# Patient Record
Sex: Female | Born: 1985 | Race: White | Hispanic: No | Marital: Married | Smoking: Former smoker
Health system: Southern US, Community
[De-identification: ages and names within clinical notes are randomized; demographics above are authoritative.]

## PROBLEM LIST (undated history)

## (undated) HISTORY — PX: DENTAL SURGERY: SHX609

## (undated) HISTORY — PX: CHOLECYSTECTOMY: SHX55

---
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
## 2005-11-20 IMAGING — US US PELVIS COMPLETE MODIFY
1 series · 14 of 25 positions shown · non-contrast
Comparison: none

CLINICAL DATA: Right lower quadrant and pelvic pain.
 US PELVIS COMPLETE/US TRANSVAGINAL
 Transabdominal and endovaginal sonography of the pelvis performed.
 Uterus measures 7.7 cm length x 3.1 cm AP x 4.1 cm transverse.  Endometrial stripe 14 mm thick, may be normal for phase of menses in a young lady.  No evidence of uterine mass or abnormal uterine fluid collection.  No free pelvic fluid.  Ovaries are normal in size and morphology bilaterally, both containing small follicles.  Right ovary 3.0 x 1.4 x 1.9 cm.  The left ovary is 3.2 x 1.8 x 2.2 cm.
 IMPRESSION
 Normal exam.

[Series 1: unknown · 0.25mm/px · 14 of 51 slices shown]
[im 1/51]
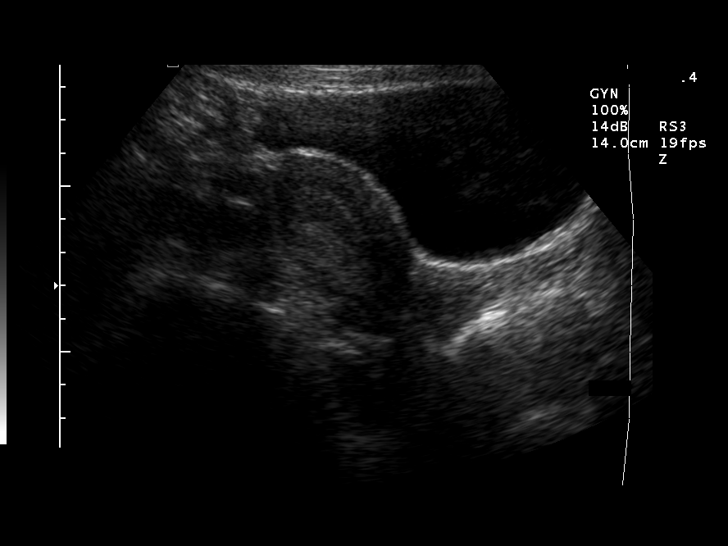
[im 5/51]
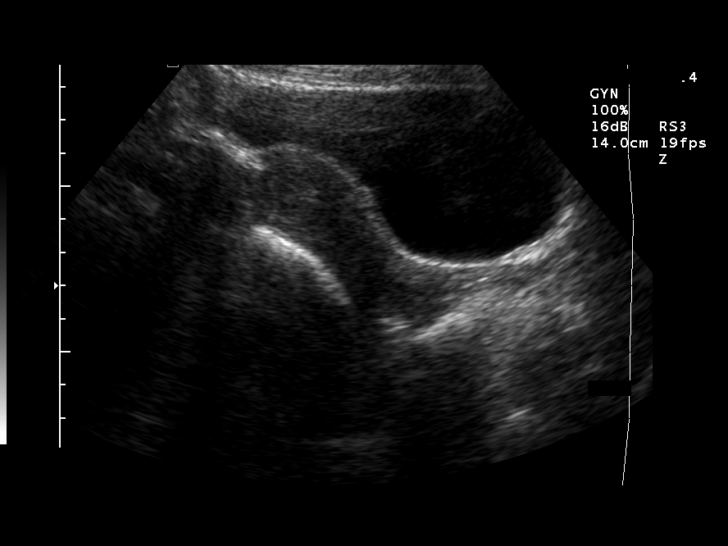
[im 9/51]
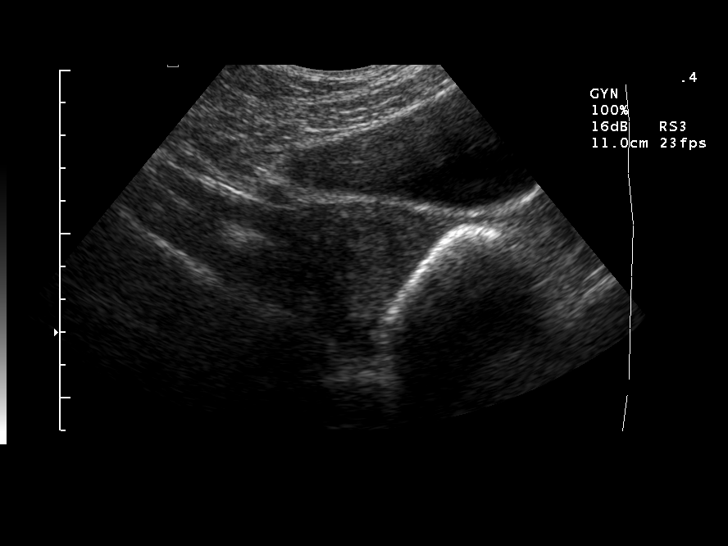
[im 13/51]
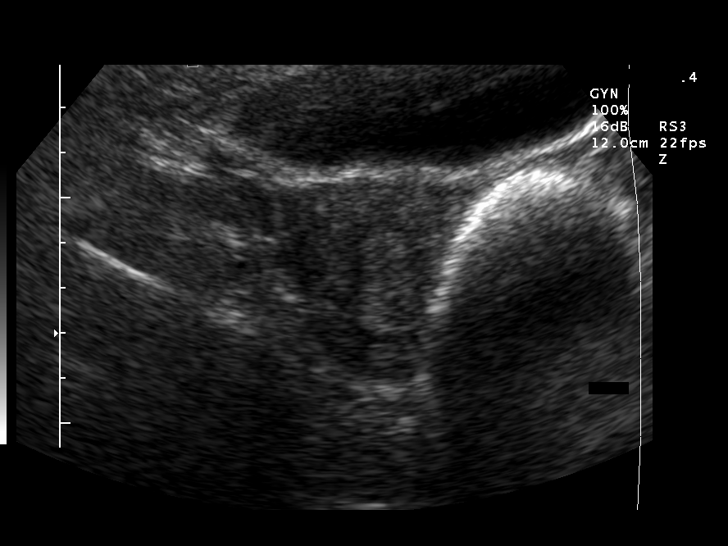
[im 17/51]
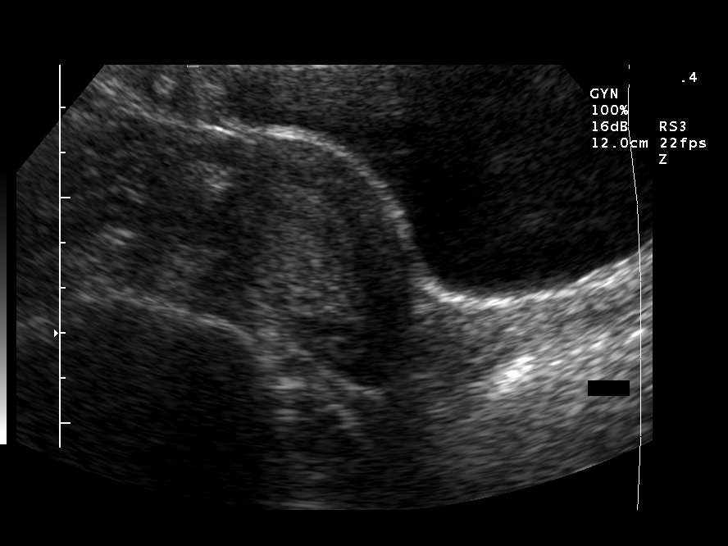
[im 19/51]
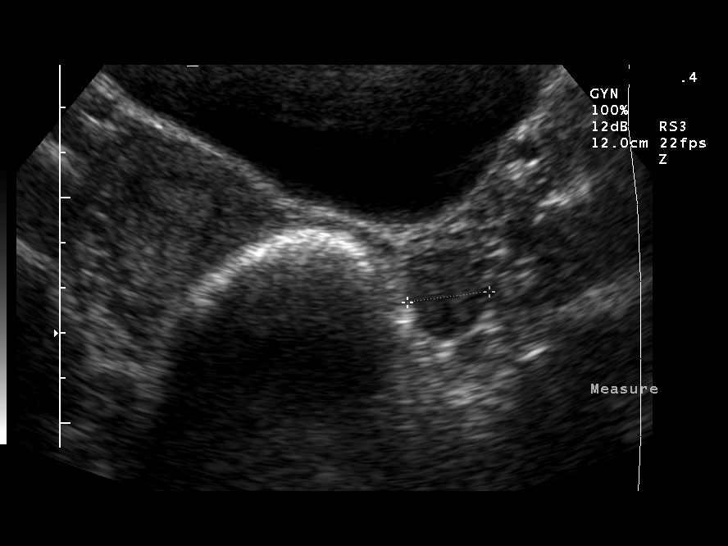
[im 23/51]
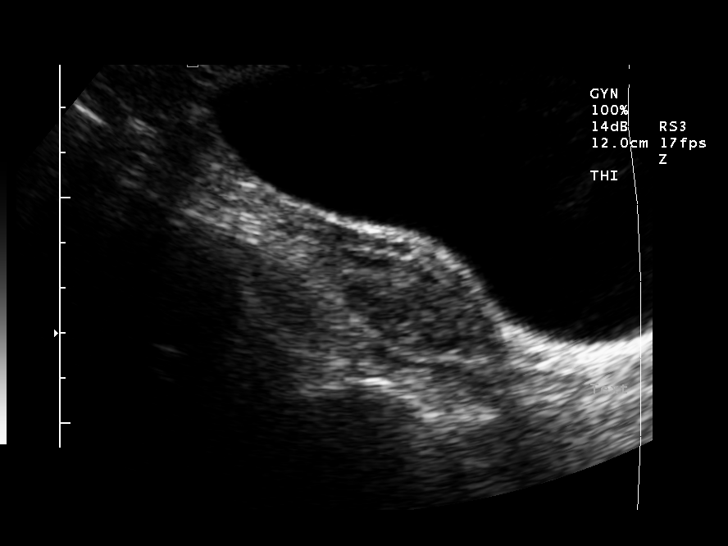
[im 28/51]
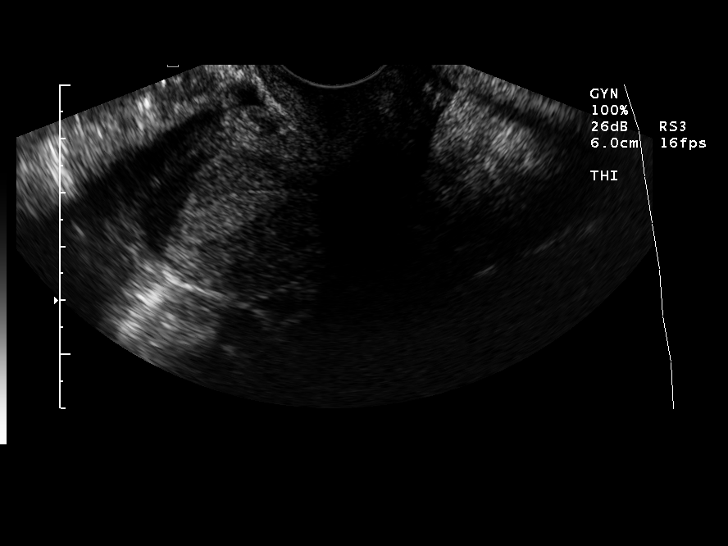
[im 32/51]
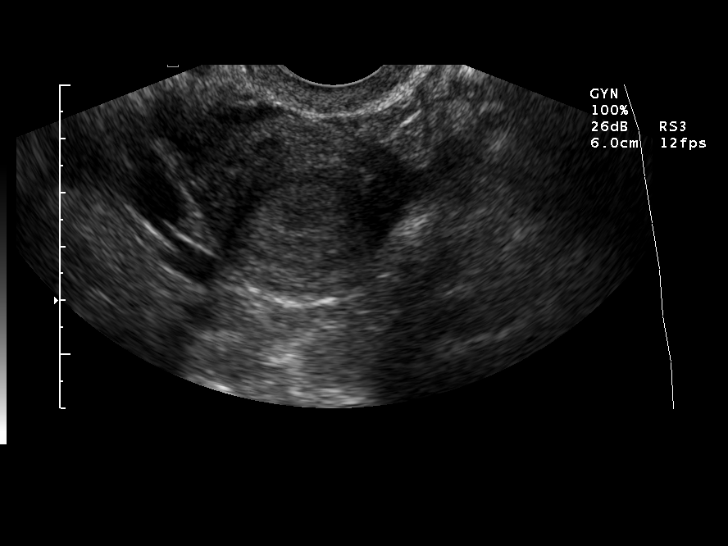
[im 34/51]
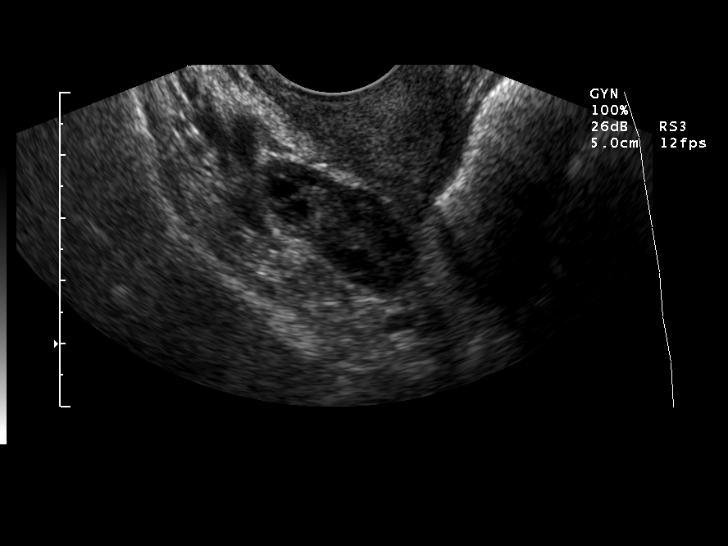
[im 38/51]
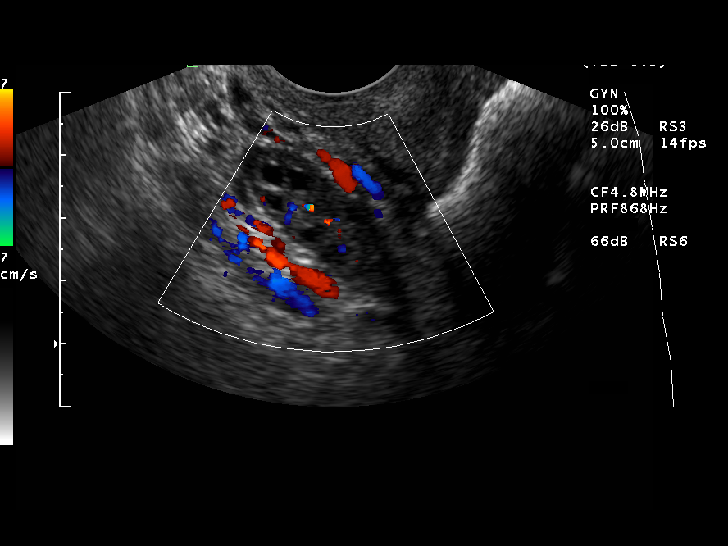
[im 42/51]
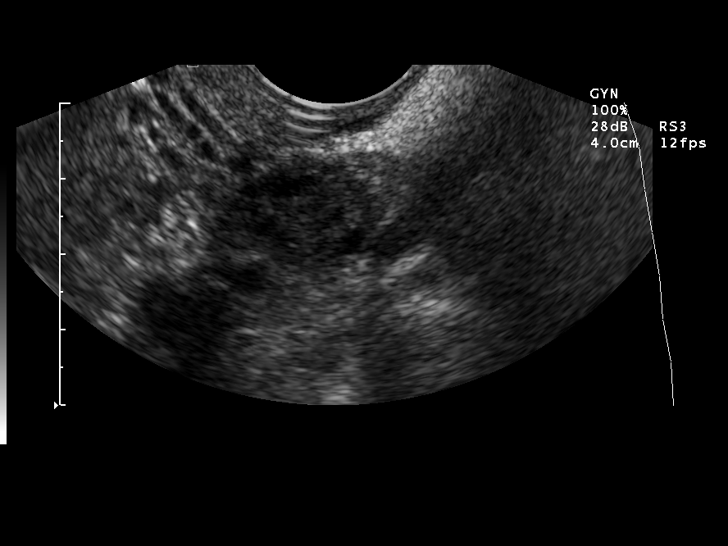
[im 46/51]
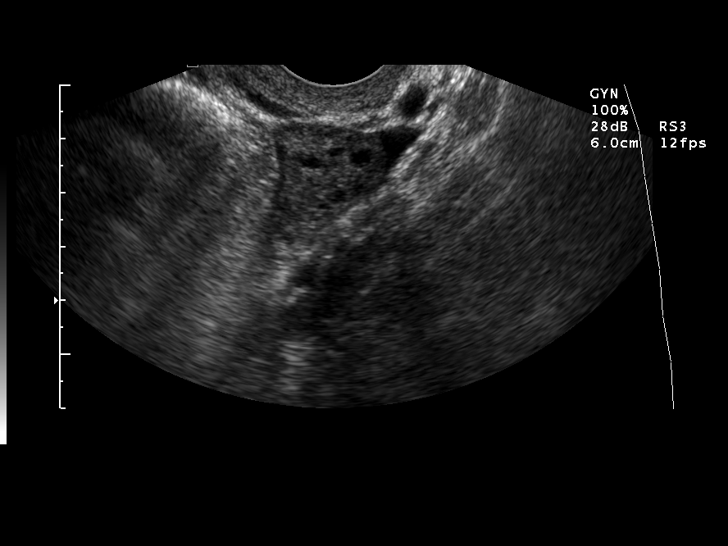
[im 51/51]
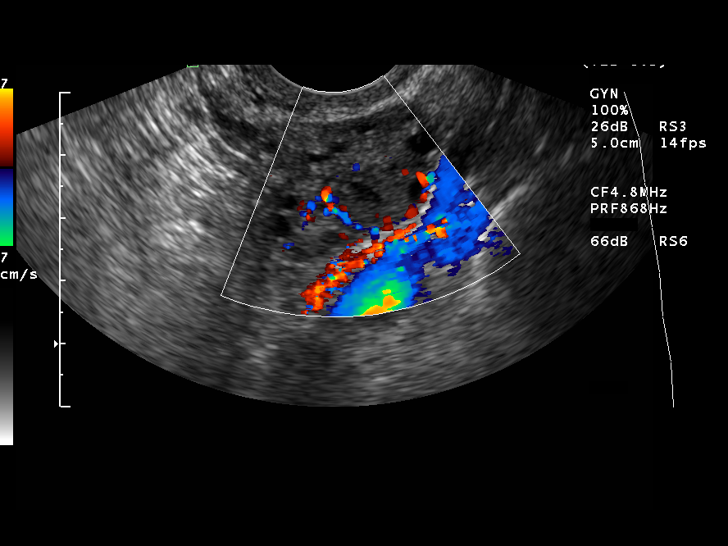

[14 of 25 positions shown; findings below may reference images not displayed]

## 2006-07-23 ENCOUNTER — Ambulatory Visit (HOSPITAL_COMMUNITY): Admission: EM | Admit: 2006-07-23 | Discharge: 2006-07-23 | Payer: Self-pay | Admitting: Obstetrics and Gynecology

## 2006-07-31 ENCOUNTER — Ambulatory Visit (HOSPITAL_COMMUNITY): Admission: RE | Admit: 2006-07-31 | Discharge: 2006-07-31 | Payer: Self-pay | Admitting: Obstetrics and Gynecology

## 2006-08-18 ENCOUNTER — Inpatient Hospital Stay (HOSPITAL_COMMUNITY): Admission: RE | Admit: 2006-08-18 | Discharge: 2006-08-20 | Payer: Self-pay | Admitting: Obstetrics and Gynecology

## 2007-03-10 ENCOUNTER — Encounter (INDEPENDENT_AMBULATORY_CARE_PROVIDER_SITE_OTHER): Payer: Self-pay | Admitting: General Surgery

## 2007-03-10 ENCOUNTER — Ambulatory Visit (HOSPITAL_COMMUNITY): Admission: RE | Admit: 2007-03-10 | Discharge: 2007-03-10 | Payer: Self-pay | Admitting: General Surgery

## 2007-03-24 ENCOUNTER — Ambulatory Visit: Payer: Self-pay | Admitting: Internal Medicine

## 2007-03-24 ENCOUNTER — Ambulatory Visit (HOSPITAL_COMMUNITY): Admission: RE | Admit: 2007-03-24 | Discharge: 2007-03-24 | Payer: Self-pay | Admitting: Internal Medicine

## 2007-03-27 ENCOUNTER — Ambulatory Visit (HOSPITAL_COMMUNITY): Admission: RE | Admit: 2007-03-27 | Discharge: 2007-03-27 | Payer: Self-pay | Admitting: Internal Medicine

## 2008-09-03 ENCOUNTER — Inpatient Hospital Stay: Payer: Self-pay | Admitting: Obstetrics and Gynecology

## 2009-02-25 IMAGING — US US ABDOMEN COMPLETE
1 series · 14 of 25 positions shown · non-contrast
Comparison: None.

ABDOMEN ULTRASOUND:

CLINICAL DATA: Jaundice. Evaluate for common bile duct stone.
TECHNIQUE: Complete abdominal ultrasound examination was performed including
evaluation of the liver, gallbladder, bile ducts, pancreas, kidneys, spleen,
IVC, and abdominal aorta.

[Series 1: us abdomen complete · 0.32mm/px · 14 of 63 slices shown]
[im 1/63]
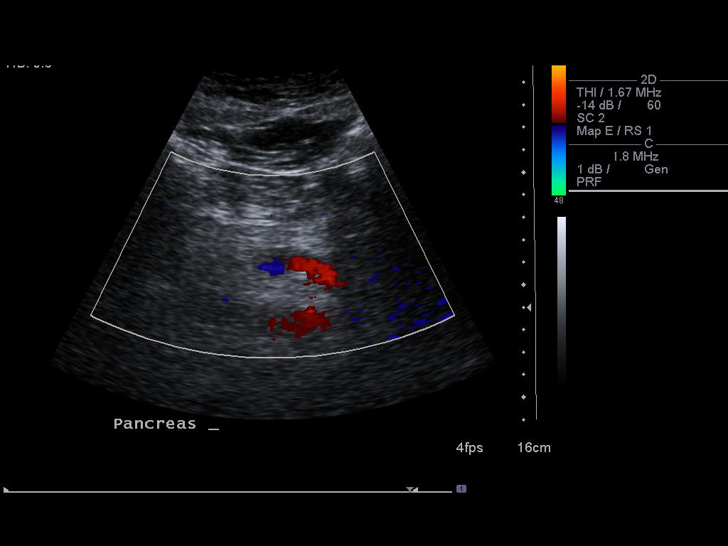
[im 6/63]
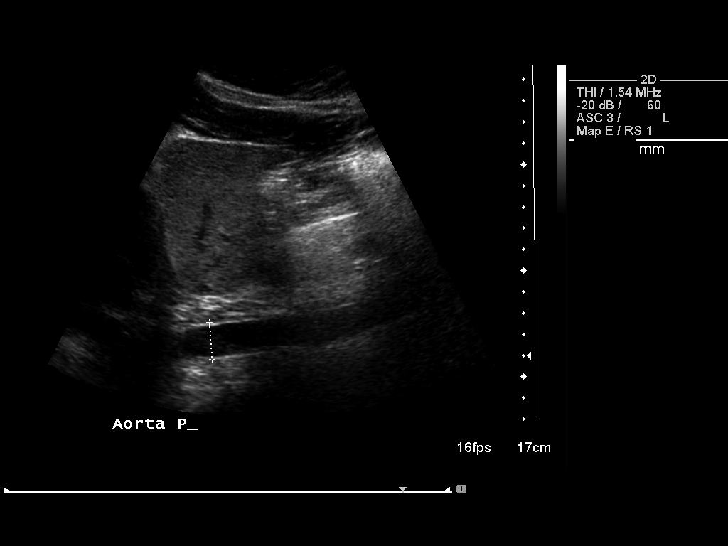
[im 11/63]
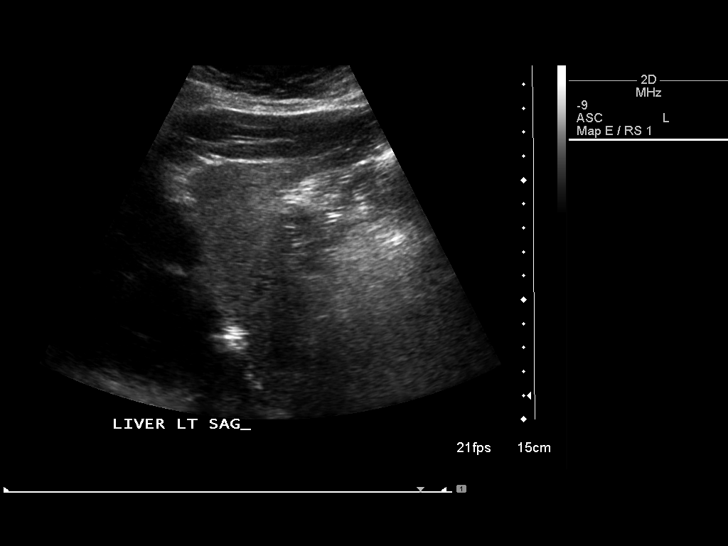
[im 16/63]
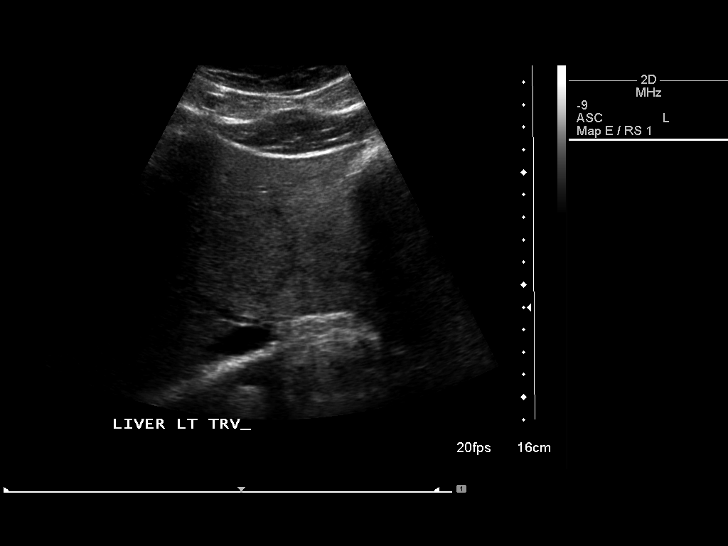
[im 21/63]
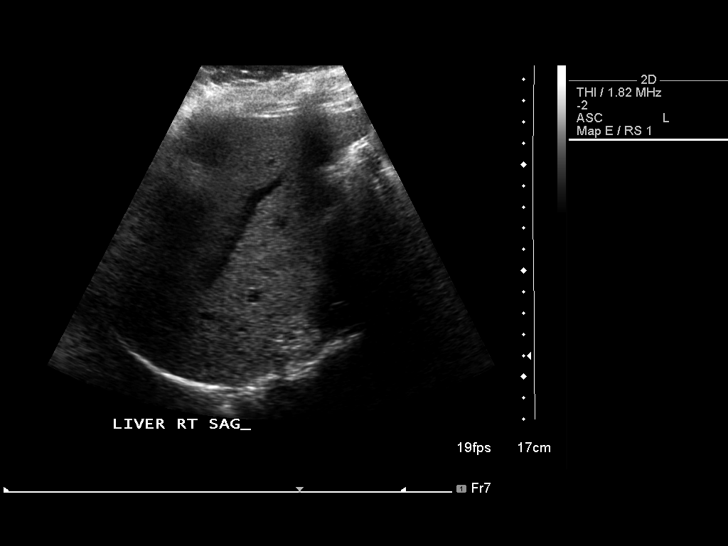
[im 24/63]
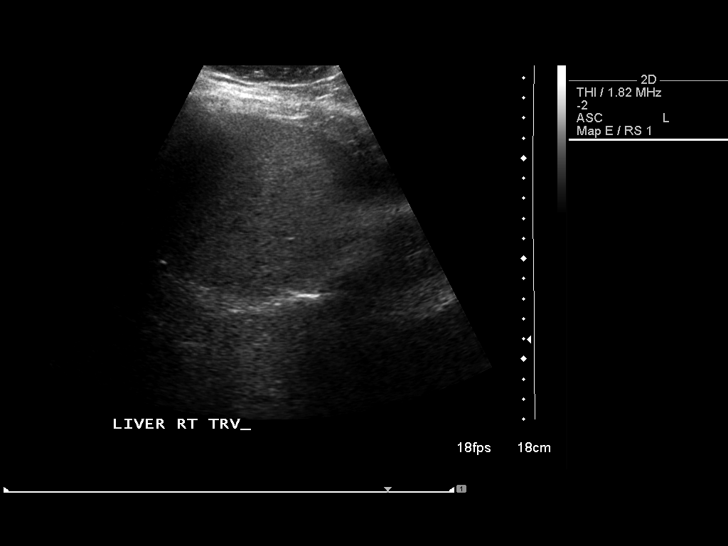
[im 29/63]
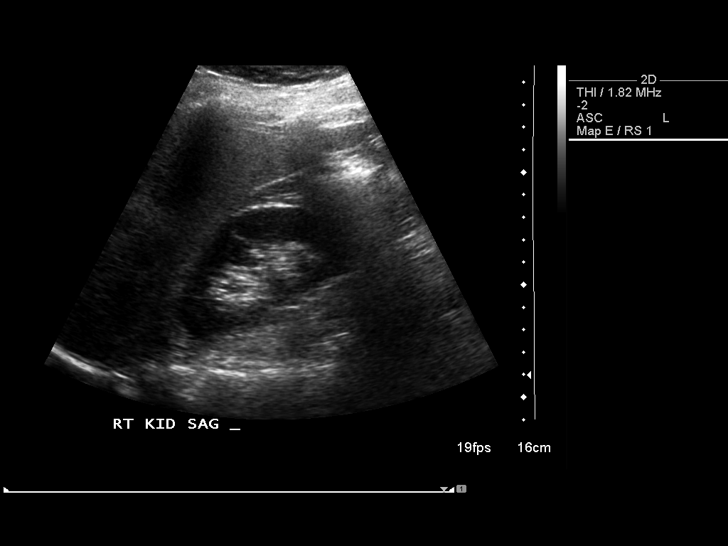
[im 34/63]
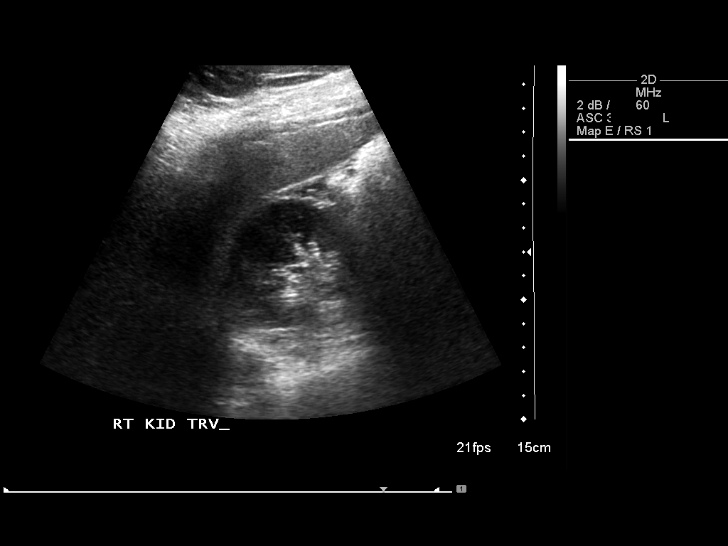
[im 39/63]
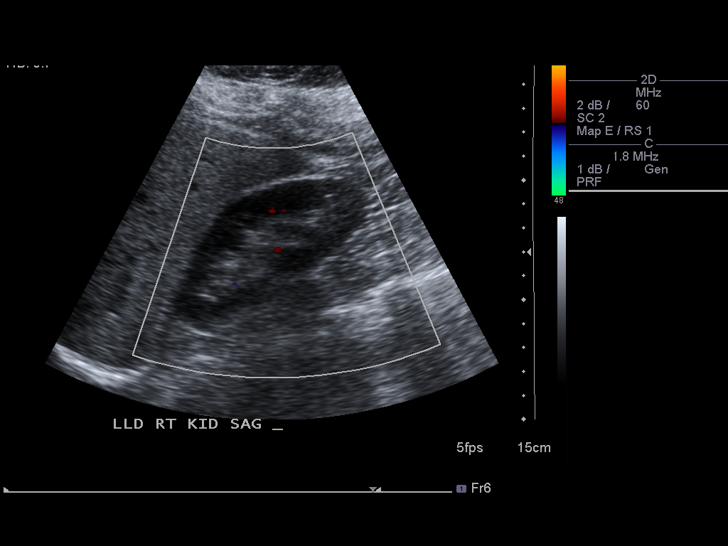
[im 42/63]
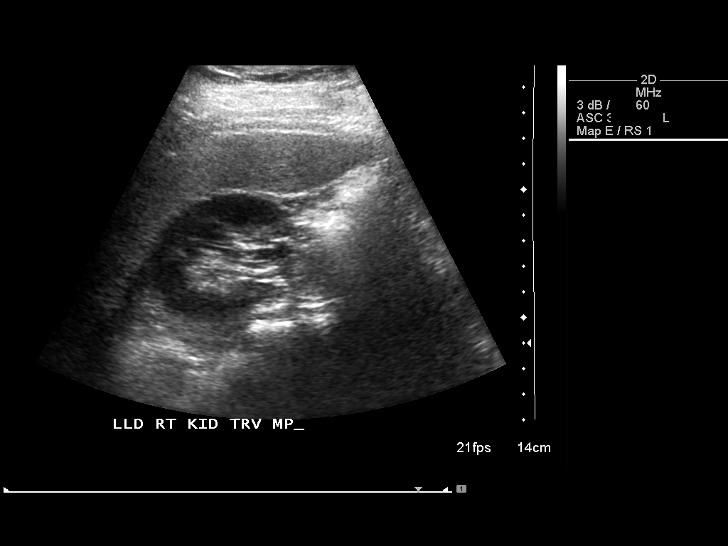
[im 47/63]
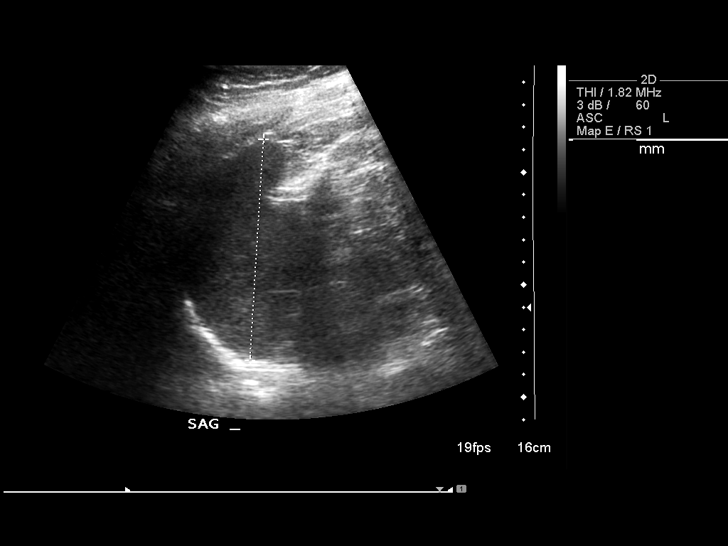
[im 52/63]
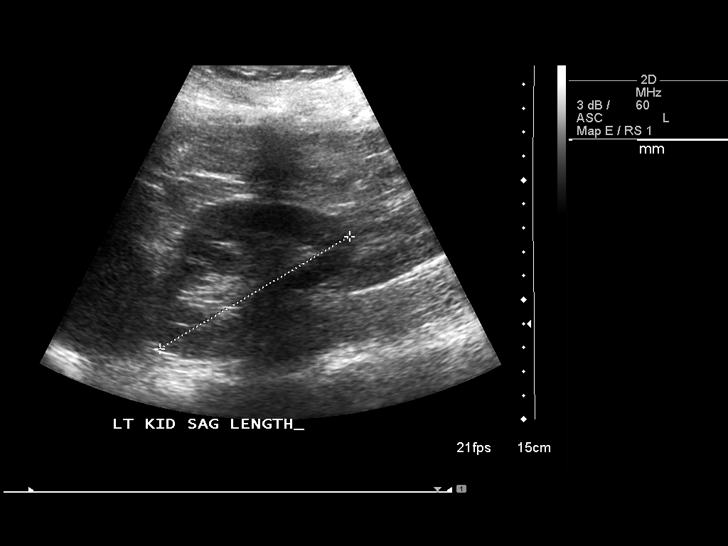
[im 57/63]
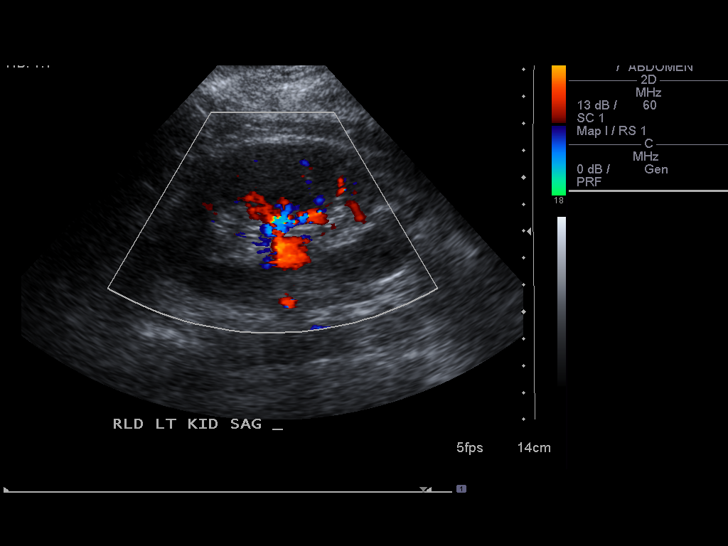
[im 63/63]
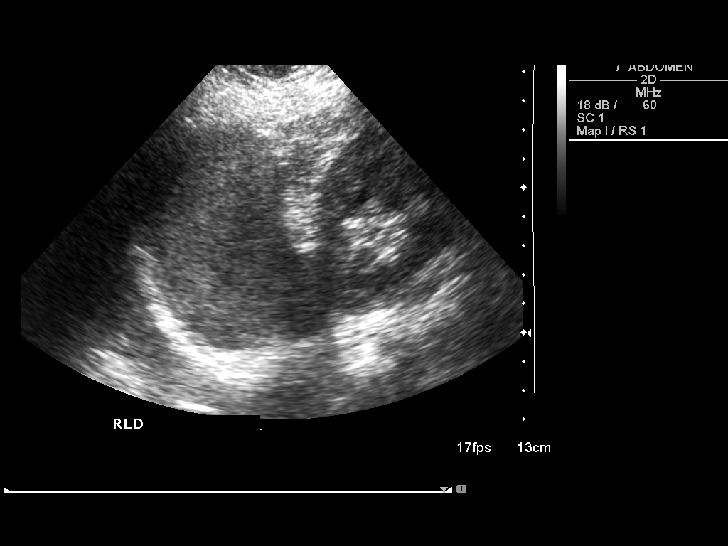

[14 of 25 positions shown; findings below may reference images not displayed]

FINDINGS: Gallbladder:  Surgically absent

Common Bile Duct:    Cannot be discretely identified secondary to overlying
bowel gas.

Liver:   Diffuse coarsening of the echotexture suggest fatty infiltration.

Inferior Vena Cava:    Normal.

Pancreas:   Obscured by overlying bowel gas.

Spleen:  Normal

Right Kidney:   9.6 cm in long axis.  Normal

Left Kidney:   9.3 cm in long axis.  Normal

Aorta:  No aneurysm
IMPRESSION: Study secondary to a substantial amount of obscuring overlying bowel gas. The
extrahepatic biliary tree could not be discretely identified. No evidence for
intrahepatic biliary dilatation.

## 2010-12-22 NOTE — Op Note (Signed)
Ashley Joyce, Ashley Joyce             ACCOUNT NO.:  [id number]   MEDICAL RECORD NO.:  [id number]          PATIENT TYPE:  AMB   LOCATION:  DAY                           FACILITY:  [facility]   PHYSICIAN:  R. Roetta Sessions, M.D. DATE OF BIRTH:  1986-06-14   DATE OF PROCEDURE:  03/27/2007  DATE OF DISCHARGE:                               OPERATIVE REPORT   CHIEF COMPLAINT:  Recent jaundice, right upper quadrant abdominal pain,  status post cholecystectomy.   Ashley Joyce  was seen urgently in our office on March 24, 2007 with right  upper quadrant abdominal pain and jaundice, status post laparoscopic  cholecystectomy for symptomatic cholelithiasis, cholecystitis.  She is  status post cholecystectomy March 10, 2007.  She did well until last  week.  She was started on Levaquin over the weekend, and told to go on a  clear liquid diet.  We are moving towards an ERCP today.  She did have  an ultrasound on Friday.  I reviewed it with Dr. Pia Mau.  The biliary  tree appeared to be normal, without any evidence of bilobar other  abnormality, common duct stone, etc.   TODAY:  Today she is no longer jaundiced.  The right upper quadrant  abdominal pain has improved significantly.  She has not had any fever or  chills, light-colored stools or dark-colored urine.  Indeed,  today her  bilirubin is down to 1.3 direct and 0.6, and alkaline phosphatase is  down to 260.  AST, ALT under 37 at 103 respectively.  Albumin 3.4.  Amylase and lipase were elevated at 235 and 165.  Serum pregnancy is  negative.   Her past medical and social history is well chronicled in our  consultation of March 24, 2007.   EXAMINATION TODAY:  Ashley Joyce does not appear to be acutely ill.  She is  not jaundiced.  There is no scleral icterus.  CHEST:  Lungs are clear to auscultation.  CARDIAC:  Regular rate and rhythm without murmur, gallop or rub.  BREAST:  Exam is deferred.  ABDOMEN:  Flat.  Positive bowel sounds.  She has minimal  right upper  quadrant tenderness to palpation.  No appreciable mass or organomegaly.   ASSESSMENT:  Almost certainly Ashley Joyce passed a stone recently, which  induced secondary biliary colic and she appears to have developed an  episode of biliary pancreatitis as well.  However, clinically the has  been that of steady improvement for the past 3 days.   An MRCP has been ordered and has been performed.  I have reviewed it  with both Dr. Lorenda Ishihara. Mansell and with Dr. Janece Canterbury.  Residual  biliary tree status post cholecystectomy looks pristine and there is no  evidence of biliary dilation, stricture or filling defect.  There is no  pancreatic head edema or other abnormalities as well.   ASSESSMENT:  Most likely a recent passage of a common duct stone with  biliary pancreatitis; much improved and no need for ERCP.   RECOMMENDATIONS:  Clear liquid diet today.  Doreatha Martin out a course of oral  Levaquin.  Advance diet  to low-fat diet tomorrow, assuming she is doing  well.  Will arrange for her to have LFTs, amylase and lipase in 3 days.  Ashley is all that should be done.  I told her husband her outlook was  felt to be excellent.  She is to call if she has any interim problems.      Jonathon Bellows, M.D.  Electronically Signed     RMR/MEDQ  D:  03/27/2007  T:  03/27/2007  Job:  [id number]   cc:   Dalia Heading, M.D.  Fax: [phone]   [facility]

## 2010-12-22 NOTE — Op Note (Signed)
Ashley Joyce, Ashley Joyce             ACCOUNT NO.:  [id number]   MEDICAL RECORD NO.:  [id number]          PATIENT TYPE:  AMB   LOCATION:  DAY                           FACILITY:  [facility]   PHYSICIAN:  Dalia Heading, M.D.  DATE OF BIRTH:  May 05, 1986   DATE OF PROCEDURE:  03/10/2007  DATE OF DISCHARGE:                               OPERATIVE REPORT   PREOPERATIVE DIAGNOSES:  1. Cholecystitis.  2. Cholelithiasis.   POSTOPERATIVE DIAGNOSES:  1. Cholecystitis.  2. Cholelithiasis.   PROCEDURE:  Laparoscopic cholecystectomy.   SURGEON:  Franky Macho, MD   ANESTHESIA:  General endotracheal.   INDICATIONS:  The patient is a 25 year old white female who presents  with cholecystitis secondary to cholelithiasis.  The risks and benefits  of the procedure including bleeding, infection, hepatobiliary injury,  and the possibility of an open procedure were fully explained to the  patient, who gave informed consent.   PROCEDURE NOTE:  The patient was placed in the supine position.  After  induction of general endotracheal anesthesia, the abdomen was prepped  and draped using the usual sterile technique with Betadine.  Surgical  site confirmation was performed.   A supraumbilical incision was made down to the fascia.  A Veress needle  was introduced into the abdominal cavity and confirmation of placement  was done using the saline drop test.  The abdomen was then insufflated  to 16 mmHg pressure.  An 11-mm trocar was introduced into the abdominal  cavity under direct visualization without difficulty.  The patient was  placed in reversed Trendelenburg position and an additional 11-mm trocar  was placed in the epigastric region and 5-mm trocars placed in the right  upper quadrant and right flank regions.  The liver was inspected and  noted to be within normal limits.  The gallbladder was retracted  superior and laterally.  The dissection was begun around the  infundibulum of the gallbladder.  The  cystic duct was first identified.  Its juncture to the infundibulum was fully identified.  The patient did  have a very thickened gallbladder wall.  The infundibulum and cystic  duct were noted to have thick tissue present.  Care was taken to make  sure of the anatomy.  An incision was made into the cystic duct and no  cystic duct stone could be found.  A vascular Endo-GIA was placed across  the base of the cystic duct and was ligated and divided.  The  gallbladder was then freed away from the gallbladder fossa using Bovie  electrocautery.  The gallbladder was delivered through the epigastric  trocar site using an EndoCatch bag.  The gallbladder fossa was inspected  and no abnormal bleeding or bile leakage was noted.  Surgicel was placed  in the gallbladder fossa.  All fluid and air were then evacuated from  the abdominal cavity prior to removal of the trocars.   All wounds were irrigated with normal saline.  All wounds were injected  with 0.5% Sensorcaine.  The supraumbilical fascia was reapproximated  using a 0 Vicryl interrupted suture.  All skin incisions were closed  using staples.  Betadine ointment and dry sterile dressings were  applied.   All tape and needle counts were correct at the end of the procedure.  The patient was extubated in the operating room and went back to the  recovery room, awake and in stable condition.   COMPLICATIONS:  None.   SPECIMEN:  Gallbladder.   BLOOD LOSS:  Minimal.      Dalia Heading, M.D.  Electronically Signed     MAJ/MEDQ  D:  03/10/2007  T:  03/11/2007  Job:  [id number]   cc:   Melony Overly, PA

## 2010-12-22 NOTE — H&P (Signed)
Ashley Joyce, Ashley Joyce             ACCOUNT NO.:  [id number]   MEDICAL RECORD NO.:  [id number]          PATIENT TYPE:  AMB   LOCATION:  DAY                           FACILITY:  [facility]   PHYSICIAN:  Dalia Heading, M.D.  DATE OF BIRTH:  08-08-1986   DATE OF ADMISSION:  03/10/2007  DATE OF DISCHARGE:  LH                              HISTORY & PHYSICAL   CHIEF COMPLAINT:  Cholecystitis, cholelithiasis.   HISTORY OF PRESENT ILLNESS:  The patient is a 25 year old white female  who is referred for evaluation and treatment of biliary colic secondary  to cholelithiasis.  She has been having right upper quadrant abdominal  pain, nausea, and bloating for many months.  She does have fatty food  intolerance.  No fever, chills, or jaundice have been noted.   PAST MEDICAL HISTORY:  Unremarkable.   PAST SURGICAL HISTORY:  Tooth extraction.   CURRENT MEDICATIONS:  Prilosec.   ALLERGIES:  KEFLEX.   REVIEW OF SYSTEMS:  The patient denies drinking or smoking.  She denies  any recent chest pain, MI, CVA, diabetes mellitus, or bleeding  disorders.   PHYSICAL EXAMINATION:  GENERAL:  The patient is a well-developed, well-  nourished white female in no acute distress.  HEENT:  Examination reveals no scleral icterus.  LUNGS:  Clear to auscultation with equal breath sounds bilaterally.  HEART:  Examination reveals a regular rate and rhythm without S3, S4, or  murmurs.  ABDOMEN:  Soft and non-distended.  She is tender in the right upper  quadrant to palpation.  No hepatosplenomegaly, masses, or hernias are  identified.   IMAGING STUDY:  Ultrasound of the gallbladder reveals cholelithiasis  with a normal common bile duct.   IMPRESSION:  1. Cholecystitis.  2. Cholelithiasis.   PLAN:  The patient is scheduled for a laparoscopic cholecystectomy on  March 10, 2007.  The risks and benefits of the procedure including  bleeding, infection, hepatobiliary injury, and the possibility of an  open procedure  were fully explained to the patient, who gave informed  consent.      Dalia Heading, M.D.  Electronically Signed     MAJ/MEDQ  D:  03/09/2007  T:  03/10/2007  Job:  [id number]   cc:   [facility]   [place], [place]

## 2010-12-22 NOTE — Assessment & Plan Note (Signed)
NAMETESSIE, ORDAZ              CHART#:  [id number]   DATE:  03/24/2007                       DOB:  12-11-1985   GASTROENTEROLOGY CONSULTATION   REASON FOR CONSULTATION:  Elevated LFTs, jaundice.   PHYSICIAN REQUESTING CONSULTATION:  Melony Overly, PA-C, with Dr. Patrica Duel   COSIGNING PHYSICIAN:  R. Roetta Sessions, MD.   HISTORY OF PRESENT ILLNESS:  Shawnelle is a pleasant, 25 year old lady,  who presents today for an urgent office visit at the request of Melony Overly, PA-C with Dr. Patrica Duel.  Rosey Bath called the office today  stating that Gelisa was noted to be jaundiced the last couple of days  and had blood work that she received today that revealed a total  bilirubin of 3, alkaline-phosphatase 460, AST of 91, ALT 274.  Lundyn  had her gallbladder out on August the 1st by Dr. Franky Macho.  Preoperatively on July 23rd and July 31st, she had normal LFTs.  She did  have cholelithiasis and acute cholecystitis.  Patient states that since  her surgery she has had some mild to moderate right upper quadrant  abdominal pain, which radiates to her back on that side.  She has had  some intermittent vomiting.  Her stools recently turned light in color.  Her husband noted two days ago that her eyes were yellow.  Patient  delayed calling her physician, but her mother made her call Melony Overly  yesterday.  She denies any fever or chills.  She has a 17-month-old son  but is not breastfeeding.  She is able to keep some foods and liquids  down.  Denies any melena or diarrhea.  She had abdominal ultrasound July  23rd when she presented with right upper quadrant abdominal pain.  She  had evidence of gallstones, but her common bile duct was normal at 2.7  mm.  The liver had a normal appearance as well.   CURRENT MEDICATIONS:  Hydrocodone p.r.n.   PAST MEDICAL HISTORY:  Frequent urinary tract infections.   PAST SURGICAL HISTORY:  Cholecystectomy on March 10, 2007, by Dr. Franky Macho.   FAMILY HISTORY:  Mother is 28 and has ulcerative colitis.  No family  history of colorectal cancer.   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   REVIEW OF SYSTEMS:  See HPI for GI.  Cardiopulmonary:  No chest pain or  shortness of breath.  Genitourinary:  No dysuria or hematuria.   PHYSICAL EXAMINATION:  VITAL SIGNS:  Weight 179, height 5 foot, temp  98.2, blood pressure 118/82, pulse 72.  GENERAL:  A pleasant, obese, Caucasian female in no acute distress.  SKIN:  Warm and dry, slightly jaundiced.  HEENT:  Sclerae are slightly icteric, oropharyngeal mucosa moist and  pink.  NECK:  No lymphadenopathy or thyromegaly.  CHEST:  Lungs are clear to auscultation.  CARDIAC:  Regular rate and rhythm, normal S1 S2, no murmurs, rubs, or  gallops.  ABDOMEN:  Positive bowel sounds, obese but symmetrical, soft.  She has  mild tenderness in the right upper quadrant region to deep palpation,  laparoscopic incisions are well healed, no organomegaly or masses, no  rebound tenderness or guarding, no abdominal hernia.  EXTREMITIES:  No edema.  LABORATORY DATA:  July 23rd and July 31st:  LFTs were normal.  August  14th:  Hemoglobin is 11.8, hematocrit  36.3, WBC 9300, platelets  366,000.  Sodium 139, potassium 4.3, BUN 8, creatinine 0.75, glucose  106.  Total bilirubin 3, alkaline-phosphatase 460, AST 91, ALT 274,  albumin 4.1, calcium 8.9.   IMPRESSION:  Azariya is a pleasant 25 year old lady, who is status  post cholecystectomy two weeks ago, who now presents with what appears  to be obstructing jaundice possibly due to a retained common bile duct  stone.  She does not appear acutely ill at this time.  She is having  some intermittent vomiting and some mild abdominal pain/right flank  pain.   PLAN:  Urgent abdominal ultrasound to be done.  We have requested the  radiologist to inform us of  the results as soon as possible.  The  patient most likely will need to have an ERCP possibly as an outpatient  basis as currently she does not appear acutely ill.  I did give her a  prescription for Levaquin 250 mg p.o. daily, #5, zero refills.  Further  recommendations to follow.  I would like to thank Melony Overly, PA-C  with Dr. Patrica Duel for allowing [place] to take part in the care of this  patient.       Tana Coast, P.A.  Electronically Signed     R. Roetta Sessions, M.D.  Electronically Signed    LL/MEDQ  D:  03/24/2007  T:  03/24/2007  Job:  [id number]   cc:   Melony Overly, PA  Patrica Duel, M.D.

## 2010-12-25 NOTE — Op Note (Signed)
[name], [name]             ACCOUNT NO.:  [id number]   MEDICAL RECORD NO.:  [id number]          PATIENT TYPE:  INP   LOCATION:  LDR2                          FACILITY:  [facility]   PHYSICIAN:  Lazaro Arms, M.D.   DATE OF BIRTH:  [birth year]   DATE OF PROCEDURE:  08/18/2006  DATE OF DISCHARGE:                               OPERATIVE REPORT   PROCEDURE PERFORMED:  Epidural catheter placement.   Ashley Joyce is a 25 year old white female gravida 1, para 0, estimated  date of delivery August 18, 2006 at [redacted] weeks gestation, who presented  in spontaneous labor and has progressed from 3 to now 5 to 6 cm.  She  has had two doses of pain medicine and she requests an epidural.   DESCRIPTION OF PROCEDURE:  The patient was placed in sitting position  and Betadine prep was used.  1% lidocaine was injected into the L3-L4  interspace as a local anesthetic.  The area was field draped.  A 17  gauge Tuohy needle was used.  A loss of resistance technique was  employed and epidural space was found in one pass without difficulty.  10 mL of 0.125% bupivacaine was given as a test dose without ill  effects.  An additional 10 mL of 0.125% bupivacaine was given to dose up  the epidural.  The epidural catheter had been fed and was taped down 5  cm in the epidural space.  Continuous infusion of 0.125% bupivacaine  with 2 mcg per mL of fentanyl was begun at 12 mL an hour.  The patient  tolerated the procedure well.  Blood pressure was stable.  Fetal heart  rate tracing stable.  She is getting pain relief.      Lazaro Arms, M.D.  Electronically Signed     LHE/MEDQ  D:  08/18/2006  T:  08/18/2006  Job:  [id number]

## 2010-12-25 NOTE — H&P (Signed)
NAMELEGNA, MAUSOLF             ACCOUNT NO.:  [id number]   MEDICAL RECORD NO.:  [id number]          PATIENT TYPE:  INP   LOCATION:  LDR2                          FACILITY:  [facility]   PHYSICIAN:  Tilda Burrow, M.D. DATE OF BIRTH:  [birth year]   DATE OF ADMISSION:  08/18/2006  DATE OF DISCHARGE:  LH                              HISTORY & PHYSICAL   Talesha is a 25 year old gravida 1, para 0, EDC is August 18, 2006,  who presents at approximately 4 o'clock this morning with spontaneous  rupture of membranes with meconium staining noted.   ALLERGIES:  She is allergic to [employer]   PAST MEDICAL HISTORY:  Negative.   PAST SURGICAL HISTORY:  Negative.   FAMILY HISTORY:  Positive for cancer.   PRENATAL COURSE:  Uneventful.  Blood type is O positive.  Rubella is  immune.  Hepatitis B surface antigen negative.  HIV negative.  HSV  negative.  Serology nonreactive.  GC and Chlamydia on both cultures were  negative.  AFP normal.  A 28-week hemoglobin 12.1, a 28-week hematocrit  37.5.  One hour glucose was 144; three hour is as follows, 79, 141, 123  and 105.   PHYSICAL EXAMINATION:  Vital signs are stable.  Cervix is about 3 cm.  There is thick meconium noted.  Fetal heart rate is reactive with  accelerations.   PLAN:  We are going to admit.  Expect vaginal delivery.  Notify Dr. Despina Hidden  of meconium staining and the desire for the patient to have an epidural  anesthesia when needed.      Zerita Boers, Lanier Clam      Tilda Burrow, M.D.  Electronically Signed    DL/MEDQ  D:  98/06/9146  T:  08/18/2006  Job:  [id number]   cc:   Francoise Schaumann. Raynelle Highland  Fax: [phone]   [facility]

## 2010-12-25 NOTE — Group Therapy Note (Signed)
NAMERONA, TOMSON             ACCOUNT NO.:  [id number]   MEDICAL RECORD NO.:  [id number]          PATIENT TYPE:  INP   LOCATION:  A411                          FACILITY:  [facility]   PHYSICIAN:  Tilda Burrow, M.D. DATE OF BIRTH:  1985-10-25   DATE OF PROCEDURE:  DATE OF DISCHARGE:                                 PROGRESS NOTE   Paisyn finally got to be 10 cm dilated and +1 station.  Her epidural  was discontinued due to the fact that it was felt she would need all of  her pushing effort.  She pushed for a good hour or so and had a  spontaneous vaginal delivery of a viable female infant at 22.  Well, she  really pushed for an hour and 45 minutes.  After the delivery of the  head, the mouth and nose were thoroughly suctioned with a DeLee suction.  A loose nuchal cord was reduced and the body delivered without  difficulty.  Despite lack of tactile stimulation, the baby had a  spontaneous cry and was transferred to the warmer where Dr. Deloria Lair was  in attendance.  Then 20 units Pitocin diluted in 1000 mL of lactated  Ringer's was infused rapidly IV.  The placenta separated spontaneously  and delivered via controlled cord traction and maternal pushing effort  at 1629.  It was inspected and appears to be intact with a three-vessel  cord.  It is being sent to pathology per protocol for meconium staining.  Estimated blood loss 300 mL.  The vagina was inspected and no  lacerations were found.  The epidural catheter was removed with the blue  tip visualized as being intact.  The Foley was removed prior to delivery  of the baby.  Measured urine output since at least 7 o'clock this  morning was only 100 mL, so we will keep her IV going and give her some  extra fluid.  Her temperature is 100.0 and pulse is in the 90 range, so  will keep an eye on all that.      Jacklyn Shell, C.N.M.      Tilda Burrow, M.D.  Electronically Signed    FC/MEDQ  D:  08/18/2006  T:   08/19/2006  Job:  [id number]   cc:   [facility]   Francoise Schaumann. Milford Cage DO, FAAP  Fax: [phone]

## 2010-12-25 NOTE — Consult Note (Signed)
NAMECHRISY, HILLEBRAND             ACCOUNT NO.:  [id number]   MEDICAL RECORD NO.:  [id number]          PATIENT TYPE:  OIB   LOCATION:  A415                          FACILITY:  [facility]   PHYSICIAN:  Tilda Burrow, M.D. DATE OF BIRTH:  Jun 27, 1986   DATE OF CONSULTATION:  07/31/2006  DATE OF DISCHARGE:  07/31/2006                                 CONSULTATION   OBSERVATION TO RULE OUT LABOR   Dawson presents at mid-day on 12/23 with complaints of contractions  off and on through the night.  She has a prior visit on 07/26/2006 to  the office where she is described as 2 cm, 50%.  External monitoring  shows essentially no contractions of the reactive strip.  She does  appear to be uncomfortable. She has had Ambien at home, but did not take  any last night.  Cervix is 1-2, 20% by my estimation, and minus 2 or  higher with the ballotable vertex.  Given the absence of progress, no  suggestion of membrane rupture and confirmed fetal well-being will not  interfere at this time.  The patient is advised to use Ambien liberally  for rest.  Followup as scheduled. Observation--1 hour.      Tilda Burrow, M.D.  Electronically Signed     JVF/MEDQ  D:  07/31/2006  T:  08/01/2006  Job:  [id number]

## 2010-12-25 NOTE — Group Therapy Note (Signed)
[name], Ashley Joyce             ACCOUNT NO.:  [id number]   MEDICAL RECORD NO.:  [id number]          PATIENT TYPE:  OIB   LOCATION:  A415                          FACILITY:  [facility]   PHYSICIAN:  Richardean Canal, M.D.  DATE OF BIRTH:  1985/09/01   DATE OF PROCEDURE:  DATE OF DISCHARGE:  07/23/2006                                 PROGRESS NOTE   HISTORY:  This gravid patient came to the birthing center complaining of  uterine contractions.  The patient was placed on the monitor, where the  fetal heart showed a reactive NST with no decelerations.  The patient  was having mild short non-painful 10 minute contractions with an  occasional longer, more painful contraction approximately every 10  minutes.  A cervix check was done, and the cervix was found to be 20%  effaced, 1 cm dilated, with a vertex at -2 station and unchanged form  the past office visit.  Terbutaline 0.25 mg was administered  subcutaneously and the patient discharged home in satisfactory  condition.           ______________________________  Richardean Canal, M.D.     RW/MEDQ  D:  07/24/2006  T:  07/24/2006  Job:  [id number]

## 2011-05-21 LAB — HEMOGLOBIN AND HEMATOCRIT, BLOOD: Hemoglobin: 12

## 2011-05-21 LAB — BASIC METABOLIC PANEL
Chloride: 106
GFR calc Af Amer: >60
GFR calc non Af Amer: >60
Glucose, Bld: 100 — ABNORMAL HIGH
Sodium: 140

## 2011-05-21 LAB — HEPATIC FUNCTION PANEL
Albumin: 3.4 — ABNORMAL LOW
Alkaline Phosphatase: 260 — ABNORMAL HIGH

## 2011-05-21 LAB — HCG, QUANTITATIVE, PREGNANCY: hCG, Beta Chain, Quant, S: <2

## 2011-05-24 LAB — BASIC METABOLIC PANEL
CO2: 23
Calcium: 8.9
Chloride: 105
GFR calc Af Amer: >60
Glucose, Bld: 88
Sodium: 139

## 2011-05-24 LAB — HEPATIC FUNCTION PANEL
AST: 21
Albumin: 3.6
Total Protein: 6.9

## 2011-05-24 LAB — CBC
MCHC: 33.5
Platelets: 288
RBC: 4.46
RDW: 16.9 — ABNORMAL HIGH

## 2013-03-19 ENCOUNTER — Emergency Department: Payer: Self-pay | Admitting: Emergency Medicine

## 2013-03-19 LAB — COMPREHENSIVE METABOLIC PANEL
Calcium, Total: 8.5 mg/dL (ref 8.5–10.1)
Creatinine: 0.85 mg/dL (ref 0.60–1.30)
SGOT(AST): 30 U/L (ref 15–37)
SGPT (ALT): 40 U/L (ref 12–78)

## 2013-03-19 LAB — URINALYSIS, COMPLETE
Blood: NEGATIVE
Specific Gravity: 1.026 (ref 1.003–1.030)
Squamous Epithelial: 9
WBC UR: 7 /HPF (ref 0–5)

## 2013-03-19 LAB — PREGNANCY, URINE: Pregnancy Test, Urine: NEGATIVE m[IU]/mL

## 2013-03-19 LAB — CBC
HCT: 41.5 % (ref 35.0–47.0)
Platelet: 187 10*3/uL (ref 150–440)

## 2013-03-19 LAB — LIPASE, BLOOD: Lipase: 122 U/L (ref 73–393)

## 2013-05-04 ENCOUNTER — Emergency Department: Payer: Self-pay | Admitting: Emergency Medicine

## 2013-05-04 LAB — CBC
HGB: 14.6 g/dL (ref 12.0–16.0)
MCH: 31.4 pg (ref 26.0–34.0)
MCHC: 34.4 g/dL (ref 32.0–36.0)
Platelet: 223 10*3/uL (ref 150–440)
RBC: 4.66 10*6/uL (ref 3.80–5.20)
RDW: 13.5 % (ref 11.5–14.5)

## 2013-05-04 LAB — URINALYSIS, COMPLETE
Glucose,UR: NEGATIVE mg/dL (ref 0–75)
Ketone: NEGATIVE
Nitrite: POSITIVE
Protein: 100
RBC,UR: 7 /HPF (ref 0–5)
WBC UR: 172 /HPF (ref 0–5)

## 2013-05-04 LAB — BASIC METABOLIC PANEL
Anion Gap: 8 (ref 7–16)
BUN: 4 mg/dL — ABNORMAL LOW (ref 7–18)
Calcium, Total: 9.1 mg/dL (ref 8.5–10.1)
Chloride: 98 mmol/L (ref 98–107)
Co2: 23 mmol/L (ref 21–32)
Creatinine: 0.85 mg/dL (ref 0.60–1.30)
EGFR (African American): >60
EGFR (Non-African Amer.): >60
Osmolality: 257 (ref 275–301)
Potassium: 3.5 mmol/L (ref 3.5–5.1)
Sodium: 129 mmol/L — ABNORMAL LOW (ref 136–145)

## 2021-08-16 ENCOUNTER — Encounter: Payer: Self-pay | Admitting: Emergency Medicine

## 2021-08-16 ENCOUNTER — Other Ambulatory Visit: Payer: Self-pay

## 2021-08-16 ENCOUNTER — Ambulatory Visit
Admission: EM | Admit: 2021-08-16 | Discharge: 2021-08-16 | Disposition: A | Discharge status: Home / Self Care | Payer: Self-pay | Attending: Student | Admitting: Student

## 2021-08-16 DIAGNOSIS — Z880 Allergy status to penicillin: Secondary | ICD-10-CM

## 2021-08-16 DIAGNOSIS — J039 Acute tonsillitis, unspecified: Secondary | ICD-10-CM

## 2021-08-16 DIAGNOSIS — Z112 Encounter for screening for other bacterial diseases: Secondary | ICD-10-CM

## 2021-08-16 LAB — POCT RAPID STREP A (OFFICE): Rapid Strep A Screen: NEGATIVE

## 2021-08-16 MED ORDER — AZITHROMYCIN 250 MG PO TABS: 250.0000 mg | ORAL_TABLET | Freq: Every day | ORAL | 0 refills | Status: DC

## 2021-08-16 NOTE — Discharge Instructions (Addendum)
-  Z-pack  -You can take Tylenol up to 1000 mg 3 times daily, and ibuprofen up to 600 mg 3 times daily with food.  You can take these together, or alternate every 3-4 hours. -Follow-up if symptoms getting worse instead of better- sore throat, trouble swallowing, etc.

## 2021-08-16 NOTE — ED Provider Notes (Addendum)
[facility]    CSN: [id number] Arrival date & time: 08/16/21  1122      History   Chief Complaint Chief Complaint  Patient presents with   Sore Throat    HPI Ashley PortelaStephanie D Joyce is a 36 y.o. female presenting with sore throat and pain radiating to the L ear with swallowing x1 day. Subjective chills, has not monitored temperature.  Denies sensation of throat closing, trouble handling secretions, voice changes.  Denies known sick contacts.  Denies cough, congestion, nausea/vomiting/diarrhea, shortness of breath.  HPI  History reviewed. No pertinent past medical history.  There are no problems to display for this patient.   History reviewed. No pertinent surgical history.  OB History   No obstetric history on file.      Home Medications    Prior to Admission medications   Medication Sig Start Date End Date Taking? Authorizing Provider  azithromycin (ZITHROMAX Z-PAK) 250 MG tablet Take 1 tablet (250 mg total) by mouth daily. Two pills today, one pill daily for next 4 days. 08/16/21  Yes Rhys MartiniGraham, Gianmarco Roye E, PA-C    Family History History reviewed. No pertinent family history.  Social History Social History   Tobacco Use   Smoking status: Former    Types: Cigarettes   Smokeless tobacco: Never  Substance Use Topics   Alcohol use: Never   Drug use: Never     Allergies   Keflex [cephalexin]   Review of Systems Review of Systems  Constitutional:  Negative for appetite change, chills and fever.  HENT:  Positive for ear pain and sore throat. Negative for congestion, rhinorrhea, sinus pressure and sinus pain.   Eyes:  Negative for redness and visual disturbance.  Respiratory:  Negative for cough, chest tightness, shortness of breath and wheezing.   Cardiovascular:  Negative for chest pain and palpitations.  Gastrointestinal:  Negative for abdominal pain, constipation, diarrhea, nausea and vomiting.  Genitourinary:  Negative for dysuria, frequency and  urgency.  Musculoskeletal:  Negative for myalgias.  Neurological:  Negative for dizziness, weakness and headaches.  Psychiatric/Behavioral:  Negative for confusion.   All other systems reviewed and are negative.   Physical Exam Triage Vital Signs ED Triage Vitals [08/16/21 1150]  Enc Vitals Group     BP 126/79     Pulse Rate (!) 121     Resp 18     Temp 100 F (37.8 C)     Temp Source Oral     SpO2 96 %     Weight      Height      Head Circumference      Peak Flow      Pain Score 8     Pain Loc      Pain Edu?      Excl. in GC?    No data found.  Updated Vital Signs BP 126/79 (BP Location: Right Arm)    Pulse (!) 121    Temp 100 F (37.8 C) (Oral)    Resp 18    LMP 07/22/2021 (Exact Date)    SpO2 96%   Visual Acuity Right Eye Distance:   Left Eye Distance:   Bilateral Distance:    Right Eye Near:   Left Eye Near:    Bilateral Near:     Physical Exam Vitals reviewed.  Constitutional:      General: She is not in acute distress.    Appearance: Normal appearance. She is not ill-appearing.  HENT:     Head:  Normocephalic and atraumatic.     Right Ear: Tympanic membrane, ear canal and external ear normal. No tenderness. No middle ear effusion. There is no impacted cerumen. Tympanic membrane is not perforated, erythematous, retracted or bulging.     Left Ear: Tympanic membrane, ear canal and external ear normal. No tenderness.  No middle ear effusion. There is no impacted cerumen. Tympanic membrane is not perforated, erythematous, retracted or bulging.     Nose: Nose normal. No congestion.     Mouth/Throat:     Mouth: Mucous membranes are moist.     Pharynx: Uvula midline. Posterior oropharyngeal erythema present. No oropharyngeal exudate.     Tonsils: Tonsillar exudate present. 1+ on the right. 1+ on the left.     Comments: Tonsils 1+ bilaterally with erythema. Exudate on L tonsil only. On exam, uvula is midline, she is tolerating her secretions without difficulty,  there is no trismus, no drooling, she has normal phonation  Eyes:     Extraocular Movements: Extraocular movements intact.     Pupils: Pupils are equal, round, and reactive to light.  Cardiovascular:     Rate and Rhythm: Regular rhythm. Tachycardia present.     Heart sounds: Normal heart sounds.  Pulmonary:     Effort: Pulmonary effort is normal.     Breath sounds: Normal breath sounds. No decreased breath sounds, wheezing, rhonchi or rales.  Abdominal:     Palpations: Abdomen is soft.     Tenderness: There is no abdominal tenderness. There is no guarding or rebound.  Lymphadenopathy:     Cervical: Cervical adenopathy present.     Right cervical: No superficial cervical adenopathy.    Left cervical: Superficial cervical adenopathy present. No deep or posterior cervical adenopathy.  Neurological:     General: No focal deficit present.     Mental Status: She is alert and oriented to person, place, and time.  Psychiatric:        Mood and Affect: Mood normal.        Behavior: Behavior normal.        Thought Content: Thought content normal.        Judgment: Judgment normal.     UC Treatments / Results  Labs (all labs ordered are listed, but only abnormal results are displayed) Labs Reviewed  CULTURE, GROUP A STREP [facility])  POCT RAPID STREP A (OFFICE)    EKG   Radiology No results found.  Procedures Procedures (including critical care time)  Medications Ordered in UC Medications - No data to display  Initial Impression / Assessment and Plan / UC Course  I have reviewed the triage vital signs and the nursing notes.  Pertinent labs & imaging results that were available during my care of the patient were reviewed by me and considered in my medical decision making (see chart for details).     This patient is a very pleasant 36 y.o. year old female presenting with exudative tonsillitis. Borderline febrile and tachy.   Rapid strep negative. Did not send culture at patient  request due to no insurance/ cost.  Given significant exudate and lymphadenopathy will manage as bacterial tonsillitis. Penicillin allergy. Z-pack sent.   ED return precautions discussed. Patient verbalizes understanding and agreement. She is a Theatre stage manager.  Coding Level 4 for acute illness with systemic symptoms, and prescription drug management   Final Clinical Impressions(s) / UC Diagnoses   Final diagnoses:  Screening for streptococcal infection  Exudative tonsillitis  Penicillin allergy     Discharge  Instructions      -Z-pack  -You can take Tylenol up to 1000 mg 3 times daily, and ibuprofen up to 600 mg 3 times daily with food.  You can take these together, or alternate every 3-4 hours. -Follow-up if symptoms getting worse instead of better- sore throat, trouble swallowing, etc.      ED Prescriptions     Medication Sig Dispense Auth. Provider   azithromycin (ZITHROMAX Z-PAK) 250 MG tablet Take 1 tablet (250 mg total) by mouth daily. Two pills today, one pill daily for next 4 days. 6 tablet Rhys Martini, PA-C      PDMP not reviewed this encounter.   Rhys Martini, PA-C 08/16/21 1220    Rhys Martini, PA-C 08/16/21 1223

## 2021-08-16 NOTE — ED Triage Notes (Signed)
Sore throat since Friday.  Left ear pain when swallowing.

## 2024-05-15 ENCOUNTER — Emergency Department (HOSPITAL_COMMUNITY)
Admission: EM | Admit: 2024-05-15 | Discharge: 2024-05-15 | Disposition: A | Discharge status: Home / Self Care | Attending: Emergency Medicine | Admitting: Emergency Medicine

## 2024-05-15 ENCOUNTER — Encounter (HOSPITAL_COMMUNITY): Payer: Self-pay | Admitting: Emergency Medicine

## 2024-05-15 ENCOUNTER — Other Ambulatory Visit: Payer: Self-pay

## 2024-05-15 DIAGNOSIS — T23292A Burn of second degree of multiple sites of left wrist and hand, initial encounter: Secondary | ICD-10-CM | POA: Insufficient documentation

## 2024-05-15 DIAGNOSIS — X088XXA Exposure to other specified smoke, fire and flames, initial encounter: Secondary | ICD-10-CM | POA: Insufficient documentation

## 2024-05-15 MED ORDER — OXYCODONE-ACETAMINOPHEN 5-325 MG PO TABS
1.0000 | ORAL_TABLET | Freq: Once | ORAL | Status: AC
  Administered 2024-05-15: 1 via ORAL
  Filled 2024-05-15: qty 1

## 2024-05-15 MED ORDER — SILVER SULFADIAZINE 1 % EX CREA: 1.0000 | TOPICAL_CREAM | Freq: Every day | CUTANEOUS | 0 refills | Status: AC

## 2024-05-15 MED ORDER — OXYCODONE HCL 5 MG PO TABS: 5.0000 mg | ORAL_TABLET | ORAL | 0 refills | Status: AC | PRN

## 2024-05-15 MED ORDER — OXYCODONE-ACETAMINOPHEN 5-325 MG PO TABS: 1.0000 | ORAL_TABLET | ORAL | 0 refills | Status: AC | PRN

## 2024-05-15 MED ORDER — IBUPROFEN 400 MG PO TABS
400.0000 mg | ORAL_TABLET | Freq: Once | ORAL | Status: AC
  Administered 2024-05-15: 400 mg via ORAL
  Filled 2024-05-15: qty 1

## 2024-05-15 MED ORDER — SILVER SULFADIAZINE 1 % EX CREA
TOPICAL_CREAM | Freq: Once | CUTANEOUS | Status: AC
  Filled 2024-05-15: qty 50

## 2024-05-15 NOTE — Discharge Instructions (Addendum)
 Apply ice as needed.  Take acetaminophen and/or ibuprofen as needed for pain.  Please be aware that when you combine acetaminophen and ibuprofen, you will get better pain relief than you get from taking either medication by itself.  Reserve oxycodone for pain not adequately relieved by the combination of acetaminophen and ibuprofen.
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
## 2024-05-23 ENCOUNTER — Encounter: Payer: Self-pay | Admitting: *Deleted

## 2024-05-23 ENCOUNTER — Ambulatory Visit: Admitting: General Surgery

## 2024-05-23 ENCOUNTER — Encounter: Payer: Self-pay | Admitting: General Surgery

## 2024-05-23 VITALS — BP 136/82 | HR 89 | Temp 98.2°F | Resp 14 | Ht 60.0 in | Wt 234.0 lb

## 2024-05-23 DIAGNOSIS — T23212A Burn of second degree of left thumb (nail), initial encounter: Secondary | ICD-10-CM

## 2024-05-23 NOTE — Patient Instructions (Signed)
 Keep hand dry and wrapped as you have been doing. Cleansing gently as you have been doing. Continue the silvadene. The blister will likely pop in the next week or so.  Continue to move your finger. Call with any concerns. Note for not wearing the glove on your left hand provided. Will do phone call in a few weeks.
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
## 2024-06-06 ENCOUNTER — Ambulatory Visit (INDEPENDENT_AMBULATORY_CARE_PROVIDER_SITE_OTHER): Admitting: General Surgery

## 2024-06-06 DIAGNOSIS — T23212A Burn of second degree of left thumb (nail), initial encounter: Secondary | ICD-10-CM

## 2024-06-06 NOTE — Progress Notes (Signed)
 Patient contacted office as we have not been able to reach her.   Patient reports that thumb is healing well, but is having some slight tingling/ stinging in the area of new skin growth.   Advised to continue to monitor. Advised that provider reviewed photos sent in earlier and felt that patient is healing well.   Advised that she may resume silvadene cream to new skin growth. Advised that the superficial skin proximal to nail bed will most likely fall off as well.   Patient requested to extend note excusing her from wearing a glove x1 week. Note written.
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
## 2024-07-20 ENCOUNTER — Ambulatory Visit

## 2024-08-13 ENCOUNTER — Ambulatory Visit

## 2024-08-13 VITALS — BP 142/90 | HR 96 | Temp 98.2°F | Ht 60.0 in | Wt 236.8 lb

## 2024-08-13 DIAGNOSIS — I10 Essential (primary) hypertension: Secondary | ICD-10-CM | POA: Insufficient documentation

## 2024-08-13 DIAGNOSIS — Z13220 Encounter for screening for lipoid disorders: Secondary | ICD-10-CM

## 2024-08-13 DIAGNOSIS — Z6841 Body Mass Index (BMI) 40.0 and over, adult: Secondary | ICD-10-CM

## 2024-08-13 DIAGNOSIS — Z113 Encounter for screening for infections with a predominantly sexual mode of transmission: Secondary | ICD-10-CM

## 2024-08-13 DIAGNOSIS — Z0001 Encounter for general adult medical examination with abnormal findings: Secondary | ICD-10-CM

## 2024-08-13 DIAGNOSIS — F419 Anxiety disorder, unspecified: Secondary | ICD-10-CM | POA: Diagnosis not present

## 2024-08-13 DIAGNOSIS — L409 Psoriasis, unspecified: Secondary | ICD-10-CM

## 2024-08-13 DIAGNOSIS — Z0000 Encounter for general adult medical examination without abnormal findings: Secondary | ICD-10-CM

## 2024-08-13 DIAGNOSIS — R5383 Other fatigue: Secondary | ICD-10-CM | POA: Diagnosis not present

## 2024-08-13 DIAGNOSIS — R739 Hyperglycemia, unspecified: Secondary | ICD-10-CM

## 2024-08-13 MED ORDER — TRIAMCINOLONE ACETONIDE 0.1 % EX CREA: 1.0000 | TOPICAL_CREAM | Freq: Two times a day (BID) | CUTANEOUS | 0 refills | Status: AC

## 2024-08-13 MED ORDER — ESCITALOPRAM OXALATE 10 MG PO TABS: 10.0000 mg | ORAL_TABLET | Freq: Every day | ORAL | 1 refills | Status: AC

## 2024-08-13 NOTE — Progress Notes (Signed)
 "  New Patient Office Visit  Subjective    Patient ID: Ashley Joyce, female    DOB: Nov 24, 1985  Age: 39 y.o. MRN: [id number]  HPI CHENE KASINGER presents to establish care and for annual physical.   The patient comes in today for a wellness visit.  A review of their health history was completed. A review of medications was also completed.  Any needed refills; No  Eating habits: Good, tries to increase protein intake and eats a variety of fruits and vegetables. Tries to limit carbs.   Falls/  MVA accidents in past few months: No  Regular exercise: Walks multiple times per week.   Sleep: Poor, struggles falling asleep.   Menstrual cycles/sexual history: Has regular menstrual cycles. Lasting 3-4 days. Flow is normal. Sexually active with 1 female partner. Has had tubal ligation.   Specialist pt sees on regular basis: None  Regular eye/dental exams: Yes  Preventative health issues were discussed.   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 Experiences testing anxiety, and struggles to finish tests due to anxiety. Sleep is not good. Says she feels like she is constantly experiencing fight or flight. Took Lexapro  in the past, and says it did work. Has tried grounding techniques, and taken ashwagandha supplement for anxiety.   Would also like to discuss her weight. Has tried weight watchers in the past before COVID, and was successful. Reports that she does not eat a lot, and does not snack. Tries to exercise by walking multiple times a week.   Has a rash on her right forearm that appeared a few weeks ago that she would like looked at. It does itch at times.   No past medical history on file.  Past Surgical History:  Procedure Laterality Date   CHOLECYSTECTOMY     DENTAL SURGERY      No family history on file.  Social History   Socioeconomic History   Marital status: Married    Spouse name: Not on file   Number of children:  Not on file   Years of education: Not on file   Highest education level: Not on file  Occupational History   Not on file  Tobacco Use   Smoking status: Former    Types: Cigarettes   Smokeless tobacco: Never  Substance and Sexual Activity   Alcohol use: Never   Drug use: Never   Sexual activity: Not on file  Other Topics Concern   Not on file  Social History Narrative   Not on file   Social Drivers of Health   Tobacco Use: Medium Risk (05/23/2024)   Patient History    Smoking Tobacco Use: Former    Smokeless Tobacco Use: Never    Passive Exposure: Not on Actuary Strain: Not on file  Food Insecurity: Not on file  Transportation Needs: Not on file  Physical Activity: Not on file  Stress: Not on file  Social Connections: Not on file  Intimate Partner Violence: Not on file  Depression (PHQ2-9): Low Risk (08/13/2024)   Depression (PHQ2-9)    PHQ-2 Score: 4  Alcohol Screen: Not on file  Housing: Not on file  Utilities: Not on file  Health Literacy: Not on file   Review of Systems  Constitutional:  Negative for activity change, appetite change, chills, fatigue and fever.  HENT:  Negative for sore throat.   Eyes:  Negative for visual disturbance.  Respiratory:  Negative for cough, shortness of breath  and wheezing.   Cardiovascular:  Negative for chest pain, palpitations and leg swelling.  Gastrointestinal:  Negative for constipation, diarrhea, nausea and vomiting.  Genitourinary:  Negative for difficulty urinating and menstrual problem.  Neurological:  Negative for headaches.  Psychiatric/Behavioral:  Positive for sleep disturbance. Negative for behavioral problems and decreased concentration. The patient is nervous/anxious.    Objective    Today's Vitals   08/13/24 1316 08/13/24 1330  BP: (!) 164/92 (!) 142/90  Pulse: 96   Temp: 98.2 F (36.8 C)   SpO2: 96%   Weight: 236 lb 12.8 oz (107.4 kg)   Height: 5' (1.524 m)    Body mass index is 46.25  kg/m.   Physical Exam Vitals and nursing note reviewed.  Constitutional:      General: She is not in acute distress.    Appearance: Normal appearance. She is not ill-appearing.  Neck:     Thyroid: No thyroid mass, thyromegaly or thyroid tenderness.  Cardiovascular:     Rate and Rhythm: Normal rate and regular rhythm.     Heart sounds: Normal heart sounds, S1 normal and S2 normal. No murmur heard. Pulmonary:     Effort: Pulmonary effort is normal. No respiratory distress.     Breath sounds: Normal breath sounds. No wheezing.  Abdominal:     General: There is no distension.     Palpations: Abdomen is soft.     Tenderness: There is no abdominal tenderness.     Hernia: No hernia is present.     Comments: No obvious masses or abnormalities noted on exam.   Musculoskeletal:     Right lower leg: No edema.     Left lower leg: No edema.  Lymphadenopathy:     Cervical: No cervical adenopathy.  Skin:    General: Skin is warm and dry.     Comments: Single, 1-cm round, well-demarcated erythematous plaque with overlying scale on right forearm.   Neurological:     Mental Status: She is alert. Mental status is at baseline.  Psychiatric:        Mood and Affect: Mood normal.        Behavior: Behavior normal.        Thought Content: Thought content normal.        Judgment: Judgment normal.       08/13/2024    1:18 PM  Depression screen PHQ 2/9  Decreased Interest 0  Down, Depressed, Hopeless 0  PHQ - 2 Score 0  Altered sleeping 2  Tired, decreased energy 1  Change in appetite 1  Feeling bad or failure about yourself  0  Trouble concentrating 0  Moving slowly or fidgety/restless 0  Suicidal thoughts 0  PHQ-9 Score 4  Difficult doing work/chores Somewhat difficult      08/13/2024    1:22 PM 08/13/2024    1:19 PM  GAD 7 : Generalized Anxiety Score  Nervous, Anxious, on Edge 3 1  Control/stop worrying  1  Worry too much - different things  1  Trouble relaxing  1  Restless  1   Easily annoyed or irritable  1  Afraid - awful might happen  0  Total GAD 7 Score  6  Anxiety Difficulty  Somewhat difficult   Assessment & Plan:  1. Well woman exam without gynecological exam (Primary) Adult wellness-complete.wellness physical was conducted today. Importance of diet and exercise were discussed in detail.  Importance of stress reduction and healthy living were discussed.  In addition to this a  discussion regarding safety was also covered.  We also reviewed over immunizations and gave recommendations regarding current immunization needed for age.   In addition to this additional areas were also touched on including: Preventative health exams needed: Pap smear  Patient was advised yearly wellness exam   2. Fatigue, unspecified type - Plan to assess baseline labs first. Will assess for sleep apnea at next visit if labs are unremarkable.  - TSH + free T4 - CBC with Differential  3. Screening for lipid disorders - Lipid panel  4. Elevated random blood glucose level - CMP14+EGFR - Hemoglobin A1c  5. Anxiety -Advised patient that this may take up to 4-6 weeks for the medication to start working.  -Educated patient on appropriate use and potential side effects such as mild GI effects and headache, which should resolve after two weeks.  -Gave box warning of suicidal ideations, and advised patient that if he experiences this to stop the medication immediately and seek care. Patient agrees.   -Recommended patient to seek counseling. Patient postponed this at this time.  - escitalopram  (LEXAPRO ) 10 MG tablet; Take 1 tablet (10 mg total) by mouth daily.  Dispense: 30 tablet; Refill: 1  6. Psoriasis -Findings consistent with psoriasis. Will treat with topical steroid cream. Advised patient to use as needed and limit use to no more than 2 weeks to avoid adverse effects.  -Educated patient on appropriate use and potential side effects.   - triamcinolone  cream (KENALOG ) 0.1  %; Apply 1 Application topically 2 (two) times daily.  Dispense: 30 g; Refill: 0  7. Screening examination for STD (sexually transmitted disease) - Hepatitis C antibody - HIV Antibody (routine testing w rflx)   8. Morbid obesity with BMI of 45.0-49.9, adult (HCC) -Discussed weight loss with patient. Patient reports limited food intake. Reviewed importance of portion sizes and caloric intake; recommended keeping a food diary to better assess intake. Advised increasing daily walking and overall physical activity. Provided education on calorie expenditure and energy balance.  -Would like to get baseline labs on patient to assess for other chronic conditions. Will reassess weight at next visit, and discuss weight loss options.   9. Essential hypertension -Patient hypertensive at visit today. Advised patient to keep blood pressure log at home, and send the readings on MyChart. Gave target blood pressure goal of less than 130/80. -Patient educated on DASH-style diet, sodium restriction, and lifestyle modifications to support blood pressure control. Patient verbalized understanding, and blood pressure will continue to be monitored at home.    Return in about 6 weeks (around 09/24/2024) for follow-up and pap smear. SABRA Damien KATHEE Gerard, FNP  "

## 2024-08-14 DIAGNOSIS — E039 Hypothyroidism, unspecified: Secondary | ICD-10-CM | POA: Insufficient documentation

## 2024-08-14 LAB — CMP14+EGFR
ALT: 37 IU/L — ABNORMAL HIGH (ref 0–32)
AST: 37 IU/L (ref 0–40)
Albumin: 4.2 g/dL (ref 3.9–4.9)
Alkaline Phosphatase: 92 IU/L (ref 41–116)
BUN/Creatinine Ratio: 10 (ref 9–23)
BUN: 8 mg/dL (ref 6–20)
Bilirubin Total: 0.4 mg/dL (ref 0.0–1.2)
CO2: 23 mmol/L (ref 20–29)
Calcium: 9.1 mg/dL (ref 8.7–10.2)
Chloride: 102 mmol/L (ref 96–106)
Creatinine, Ser: 0.83 mg/dL (ref 0.57–1.00)
Globulin, Total: 3.4 g/dL (ref 1.5–4.5)
Glucose: 101 mg/dL — ABNORMAL HIGH (ref 70–99)
Potassium: 4.2 mmol/L (ref 3.5–5.2)
Sodium: 139 mmol/L (ref 134–144)
Total Protein: 7.6 g/dL (ref 6.0–8.5)
eGFR: 92 mL/min/1.73

## 2024-08-14 LAB — CBC WITH DIFFERENTIAL/PLATELET
Basophils Absolute: 0 x10E3/uL (ref 0.0–0.2)
Basos: 1 %
EOS (ABSOLUTE): 0.2 x10E3/uL (ref 0.0–0.4)
Eos: 2 %
Hematocrit: 37.8 % (ref 34.0–46.6)
Hemoglobin: 12 g/dL (ref 11.1–15.9)
Immature Grans (Abs): 0 x10E3/uL (ref 0.0–0.1)
Immature Granulocytes: 0 %
Lymphocytes Absolute: 1.8 x10E3/uL (ref 0.7–3.1)
Lymphs: 27 %
MCH: 28.5 pg (ref 26.6–33.0)
MCHC: 31.7 g/dL (ref 31.5–35.7)
MCV: 90 fL (ref 79–97)
Monocytes Absolute: 0.5 x10E3/uL (ref 0.1–0.9)
Monocytes: 7 %
Neutrophils Absolute: 4.3 x10E3/uL (ref 1.4–7.0)
Neutrophils: 63 %
Platelets: 127 x10E3/uL — ABNORMAL LOW (ref 150–450)
RBC: 4.21 x10E6/uL (ref 3.77–5.28)
RDW: 15.4 % (ref 11.7–15.4)
WBC: 6.8 x10E3/uL (ref 3.4–10.8)

## 2024-08-14 LAB — HEMOGLOBIN A1C
Est. average glucose Bld gHb Est-mCnc: 120 mg/dL
Hgb A1c MFr Bld: 5.8 % — ABNORMAL HIGH (ref 4.8–5.6)

## 2024-08-14 LAB — LIPID PANEL
Chol/HDL Ratio: 5.3 ratio — ABNORMAL HIGH (ref 0.0–4.4)
Cholesterol, Total: 170 mg/dL (ref 100–199)
HDL: 32 mg/dL — ABNORMAL LOW
LDL Chol Calc (NIH): 81 mg/dL (ref 0–99)
Triglycerides: 352 mg/dL — ABNORMAL HIGH (ref 0–149)
VLDL Cholesterol Cal: 57 mg/dL — ABNORMAL HIGH (ref 5–40)

## 2024-08-14 LAB — TSH+FREE T4
Free T4: 0.77 ng/dL — ABNORMAL LOW (ref 0.82–1.77)
TSH: 6.44 u[IU]/mL — ABNORMAL HIGH (ref 0.450–4.500)

## 2024-08-14 MED ORDER — LEVOTHYROXINE SODIUM 50 MCG PO TABS: 50.0000 ug | ORAL_TABLET | Freq: Every day | ORAL | 2 refills | Status: AC

## 2024-08-23 ENCOUNTER — Telehealth: Payer: Self-pay

## 2024-08-23 NOTE — Telephone Encounter (Signed)
 Form for [facility] needs to be filled out we need to call patient when ready to be picked up.  [phone]

## 2024-10-03 ENCOUNTER — Encounter
